# Patient Record
Sex: Female | Born: 1955 | Hispanic: Yes | Marital: Married
Health system: Southern US, Community
[De-identification: ages and names within clinical notes are randomized; demographics above are authoritative.]

---

## 2004-08-29 ENCOUNTER — Ambulatory Visit: Payer: Self-pay

## 2004-10-17 ENCOUNTER — Emergency Department: Payer: Self-pay | Admitting: Emergency Medicine

## 2004-11-01 ENCOUNTER — Ambulatory Visit: Payer: Self-pay

## 2004-11-06 ENCOUNTER — Encounter: Payer: Self-pay | Admitting: Family Medicine

## 2005-11-26 ENCOUNTER — Ambulatory Visit: Payer: Self-pay | Admitting: Obstetrics and Gynecology

## 2005-11-26 ENCOUNTER — Ambulatory Visit: Payer: Self-pay

## 2005-12-20 ENCOUNTER — Ambulatory Visit: Payer: Self-pay | Admitting: Gastroenterology

## 2006-04-22 ENCOUNTER — Ambulatory Visit: Payer: Self-pay | Admitting: Gastroenterology

## 2006-06-24 ENCOUNTER — Ambulatory Visit: Payer: Self-pay | Admitting: Gastroenterology

## 2006-12-02 ENCOUNTER — Ambulatory Visit: Payer: Self-pay | Admitting: Obstetrics and Gynecology

## 2007-06-01 ENCOUNTER — Ambulatory Visit: Payer: Self-pay | Admitting: Obstetrics and Gynecology

## 2007-12-03 ENCOUNTER — Ambulatory Visit: Payer: Self-pay | Admitting: Obstetrics and Gynecology

## 2008-02-29 ENCOUNTER — Ambulatory Visit: Payer: Self-pay | Admitting: Gastroenterology

## 2008-08-03 ENCOUNTER — Ambulatory Visit (HOSPITAL_COMMUNITY): Admission: RE | Admit: 2008-08-03 | Discharge: 2008-08-03 | Payer: Self-pay | Admitting: Orthopedic Surgery

## 2008-09-29 ENCOUNTER — Ambulatory Visit (HOSPITAL_BASED_OUTPATIENT_CLINIC_OR_DEPARTMENT_OTHER): Admission: RE | Admit: 2008-09-29 | Discharge: 2008-09-30 | Payer: Self-pay | Admitting: Orthopedic Surgery

## 2009-02-22 ENCOUNTER — Ambulatory Visit: Payer: Self-pay

## 2010-03-15 ENCOUNTER — Ambulatory Visit: Payer: Self-pay | Admitting: Obstetrics and Gynecology

## 2010-04-20 LAB — HEPATIC FUNCTION PANEL
ALT: 80 U/L — ABNORMAL HIGH (ref 0–35)
Alkaline Phosphatase: 82 U/L (ref 39–117)
Bilirubin, Direct: 0.2 mg/dL (ref 0.0–0.3)
Indirect Bilirubin: 0.7 mg/dL (ref 0.3–0.9)
Total Protein: 7.3 g/dL (ref 6.0–8.3)

## 2010-04-20 LAB — POCT HEMOGLOBIN-HEMACUE: Hemoglobin: 16.1 g/dL — ABNORMAL HIGH (ref 12.0–15.0)

## 2010-04-20 LAB — BASIC METABOLIC PANEL
BUN: 14 mg/dL (ref 6–23)
Creatinine, Ser: 0.46 mg/dL (ref 0.4–1.2)
GFR calc non Af Amer: >60 mL/min (ref >60)
Glucose, Bld: 108 mg/dL — ABNORMAL HIGH (ref 70–99)
Potassium: 3.7 mEq/L (ref 3.5–5.1)

## 2012-08-19 ENCOUNTER — Ambulatory Visit: Payer: Self-pay | Admitting: Obstetrics and Gynecology

## 2013-06-19 ENCOUNTER — Emergency Department: Payer: Self-pay | Admitting: Emergency Medicine

## 2013-06-19 LAB — BASIC METABOLIC PANEL
Anion Gap: 5 — ABNORMAL LOW (ref 7–16)
BUN: 14 mg/dL (ref 7–18)
Calcium, Total: 9.4 mg/dL (ref 8.5–10.1)
Chloride: 104 mmol/L (ref 98–107)
Co2: 30 mmol/L (ref 21–32)
Creatinine: 0.63 mg/dL (ref 0.60–1.30)
EGFR (African American): >60
EGFR (Non-African Amer.): >60
Glucose: 143 mg/dL — ABNORMAL HIGH (ref 65–99)
Osmolality: 280 (ref 275–301)
Potassium: 3.7 mmol/L (ref 3.5–5.1)
Sodium: 139 mmol/L (ref 136–145)

## 2013-06-19 LAB — HEPATIC FUNCTION PANEL A (ARMC)
ALK PHOS: 118 U/L — AB
ALT: 135 U/L — AB (ref 12–78)
AST: 69 U/L — AB (ref 15–37)
Albumin: 4.2 g/dL (ref 3.4–5.0)
Bilirubin, Direct: <0.1 mg/dL (ref 0.00–0.20)
Bilirubin,Total: 0.5 mg/dL (ref 0.2–1.0)
TOTAL PROTEIN: 8.1 g/dL (ref 6.4–8.2)

## 2013-06-19 LAB — CBC
HCT: 44.9 % (ref 35.0–47.0)
HGB: 15 g/dL (ref 12.0–16.0)
MCH: 28.7 pg (ref 26.0–34.0)
MCHC: 33.4 g/dL (ref 32.0–36.0)
MCV: 86 fL (ref 80–100)
Platelet: 251 10*3/uL (ref 150–440)
RBC: 5.22 10*6/uL — ABNORMAL HIGH (ref 3.80–5.20)
RDW: 12.3 % (ref 11.5–14.5)
WBC: 8.8 10*3/uL (ref 3.6–11.0)

## 2013-06-19 LAB — TROPONIN I: Troponin-I: <0.02 ng/mL

## 2014-10-03 ENCOUNTER — Other Ambulatory Visit: Payer: Self-pay | Admitting: Obstetrics and Gynecology

## 2014-10-03 DIAGNOSIS — Z1231 Encounter for screening mammogram for malignant neoplasm of breast: Secondary | ICD-10-CM

## 2014-10-13 ENCOUNTER — Ambulatory Visit
Admission: RE | Admit: 2014-10-13 | Discharge: 2014-10-13 | Disposition: A | Discharge status: Home / Self Care | Payer: 59 | Source: Ambulatory Visit | Attending: Obstetrics and Gynecology | Admitting: Obstetrics and Gynecology

## 2014-10-13 DIAGNOSIS — Z1231 Encounter for screening mammogram for malignant neoplasm of breast: Secondary | ICD-10-CM | POA: Insufficient documentation

## 2014-10-17 ENCOUNTER — Other Ambulatory Visit: Payer: Self-pay | Admitting: Obstetrics and Gynecology

## 2014-10-17 DIAGNOSIS — R928 Other abnormal and inconclusive findings on diagnostic imaging of breast: Secondary | ICD-10-CM

## 2014-10-17 DIAGNOSIS — N6489 Other specified disorders of breast: Secondary | ICD-10-CM

## 2014-10-27 ENCOUNTER — Ambulatory Visit
Admission: RE | Admit: 2014-10-27 | Discharge: 2014-10-27 | Disposition: A | Discharge status: Home / Self Care | Payer: 59 | Source: Ambulatory Visit | Attending: Obstetrics and Gynecology | Admitting: Obstetrics and Gynecology

## 2014-10-27 DIAGNOSIS — R928 Other abnormal and inconclusive findings on diagnostic imaging of breast: Secondary | ICD-10-CM | POA: Insufficient documentation

## 2014-10-27 DIAGNOSIS — N6489 Other specified disorders of breast: Secondary | ICD-10-CM

## 2015-10-16 ENCOUNTER — Other Ambulatory Visit: Payer: Self-pay | Admitting: Obstetrics and Gynecology

## 2015-10-17 ENCOUNTER — Other Ambulatory Visit: Payer: Self-pay | Admitting: Obstetrics and Gynecology

## 2015-10-17 DIAGNOSIS — Z1231 Encounter for screening mammogram for malignant neoplasm of breast: Secondary | ICD-10-CM

## 2015-10-17 DIAGNOSIS — Z13820 Encounter for screening for osteoporosis: Secondary | ICD-10-CM

## 2015-11-27 ENCOUNTER — Ambulatory Visit
Admission: RE | Admit: 2015-11-27 | Discharge: 2015-11-27 | Disposition: A | Discharge status: Home / Self Care | Payer: 59 | Source: Ambulatory Visit | Attending: Obstetrics and Gynecology | Admitting: Obstetrics and Gynecology

## 2015-11-27 DIAGNOSIS — Z1231 Encounter for screening mammogram for malignant neoplasm of breast: Secondary | ICD-10-CM | POA: Diagnosis not present

## 2015-11-28 ENCOUNTER — Other Ambulatory Visit: Payer: Self-pay | Admitting: Obstetrics and Gynecology

## 2015-11-28 DIAGNOSIS — N6310 Unspecified lump in the right breast, unspecified quadrant: Secondary | ICD-10-CM

## 2015-12-12 ENCOUNTER — Ambulatory Visit
Admission: RE | Admit: 2015-12-12 | Discharge: 2015-12-12 | Disposition: A | Discharge status: Home / Self Care | Payer: 59 | Source: Ambulatory Visit | Attending: Obstetrics and Gynecology | Admitting: Obstetrics and Gynecology

## 2015-12-12 DIAGNOSIS — N6314 Unspecified lump in the right breast, lower inner quadrant: Secondary | ICD-10-CM | POA: Diagnosis not present

## 2015-12-12 DIAGNOSIS — N6310 Unspecified lump in the right breast, unspecified quadrant: Secondary | ICD-10-CM

## 2015-12-12 DIAGNOSIS — R921 Mammographic calcification found on diagnostic imaging of breast: Secondary | ICD-10-CM | POA: Insufficient documentation

## 2015-12-14 ENCOUNTER — Other Ambulatory Visit: Payer: Self-pay | Admitting: Obstetrics and Gynecology

## 2015-12-14 DIAGNOSIS — N6310 Unspecified lump in the right breast, unspecified quadrant: Secondary | ICD-10-CM

## 2015-12-14 DIAGNOSIS — R928 Other abnormal and inconclusive findings on diagnostic imaging of breast: Secondary | ICD-10-CM

## 2016-01-04 ENCOUNTER — Ambulatory Visit: Payer: 59

## 2016-01-12 ENCOUNTER — Ambulatory Visit
Admission: RE | Admit: 2016-01-12 | Discharge: 2016-01-12 | Disposition: A | Discharge status: Home / Self Care | Payer: 59 | Source: Ambulatory Visit | Attending: Obstetrics and Gynecology | Admitting: Obstetrics and Gynecology

## 2016-01-12 DIAGNOSIS — N6001 Solitary cyst of right breast: Secondary | ICD-10-CM | POA: Insufficient documentation

## 2016-01-12 DIAGNOSIS — R928 Other abnormal and inconclusive findings on diagnostic imaging of breast: Secondary | ICD-10-CM

## 2016-01-12 DIAGNOSIS — N6310 Unspecified lump in the right breast, unspecified quadrant: Secondary | ICD-10-CM

## 2016-01-12 HISTORY — PX: BREAST CYST ASPIRATION: SHX578

## 2016-08-20 ENCOUNTER — Other Ambulatory Visit: Payer: Self-pay | Admitting: Obstetrics and Gynecology

## 2016-08-20 ENCOUNTER — Other Ambulatory Visit: Payer: Self-pay | Admitting: Family Medicine

## 2016-08-20 DIAGNOSIS — Z1231 Encounter for screening mammogram for malignant neoplasm of breast: Secondary | ICD-10-CM

## 2016-09-03 ENCOUNTER — Ambulatory Visit
Admission: RE | Admit: 2016-09-03 | Discharge: 2016-09-03 | Disposition: A | Discharge status: Home / Self Care | Payer: BLUE CROSS/BLUE SHIELD | Source: Ambulatory Visit | Attending: Family Medicine | Admitting: Family Medicine

## 2016-09-03 DIAGNOSIS — Z1231 Encounter for screening mammogram for malignant neoplasm of breast: Secondary | ICD-10-CM | POA: Insufficient documentation

## 2017-09-08 ENCOUNTER — Other Ambulatory Visit: Payer: Self-pay | Admitting: Obstetrics and Gynecology

## 2017-09-08 DIAGNOSIS — Z1231 Encounter for screening mammogram for malignant neoplasm of breast: Secondary | ICD-10-CM

## 2017-10-02 ENCOUNTER — Ambulatory Visit
Admission: RE | Admit: 2017-10-02 | Discharge: 2017-10-02 | Disposition: A | Discharge status: Home / Self Care | Payer: BLUE CROSS/BLUE SHIELD | Source: Ambulatory Visit | Attending: Obstetrics and Gynecology | Admitting: Obstetrics and Gynecology

## 2017-10-02 DIAGNOSIS — Z1231 Encounter for screening mammogram for malignant neoplasm of breast: Secondary | ICD-10-CM | POA: Diagnosis not present

## 2018-10-23 ENCOUNTER — Other Ambulatory Visit: Payer: Self-pay | Admitting: Obstetrics and Gynecology

## 2018-10-23 DIAGNOSIS — Z1231 Encounter for screening mammogram for malignant neoplasm of breast: Secondary | ICD-10-CM

## 2018-11-25 ENCOUNTER — Ambulatory Visit
Admission: RE | Admit: 2018-11-25 | Discharge: 2018-11-25 | Disposition: A | Discharge status: Home / Self Care | Payer: BC Managed Care – PPO | Source: Ambulatory Visit | Attending: Obstetrics and Gynecology | Admitting: Obstetrics and Gynecology

## 2018-11-25 ENCOUNTER — Other Ambulatory Visit: Payer: Self-pay

## 2018-11-25 ENCOUNTER — Encounter (INDEPENDENT_AMBULATORY_CARE_PROVIDER_SITE_OTHER): Payer: Self-pay

## 2018-11-25 DIAGNOSIS — Z1231 Encounter for screening mammogram for malignant neoplasm of breast: Secondary | ICD-10-CM | POA: Diagnosis not present

## 2019-10-01 ENCOUNTER — Other Ambulatory Visit: Payer: Self-pay | Admitting: Obstetrics and Gynecology

## 2019-10-01 DIAGNOSIS — Z1231 Encounter for screening mammogram for malignant neoplasm of breast: Secondary | ICD-10-CM

## 2019-11-26 ENCOUNTER — Ambulatory Visit
Admission: RE | Admit: 2019-11-26 | Discharge: 2019-11-26 | Disposition: A | Discharge status: Home / Self Care | Payer: BC Managed Care – PPO | Source: Ambulatory Visit | Attending: Obstetrics and Gynecology | Admitting: Obstetrics and Gynecology

## 2019-11-26 ENCOUNTER — Other Ambulatory Visit: Payer: Self-pay

## 2019-11-26 DIAGNOSIS — Z1231 Encounter for screening mammogram for malignant neoplasm of breast: Secondary | ICD-10-CM | POA: Insufficient documentation

## 2020-09-07 ENCOUNTER — Encounter: Payer: BC Managed Care – PPO | Admitting: Obstetrics and Gynecology

## 2020-10-04 ENCOUNTER — Other Ambulatory Visit: Payer: Self-pay | Admitting: Obstetrics and Gynecology

## 2020-10-04 DIAGNOSIS — Z1231 Encounter for screening mammogram for malignant neoplasm of breast: Secondary | ICD-10-CM

## 2020-11-27 ENCOUNTER — Ambulatory Visit
Admission: RE | Admit: 2020-11-27 | Discharge: 2020-11-27 | Disposition: A | Discharge status: Home / Self Care | Payer: BC Managed Care – PPO | Source: Ambulatory Visit | Attending: Obstetrics and Gynecology | Admitting: Obstetrics and Gynecology

## 2020-11-27 ENCOUNTER — Other Ambulatory Visit: Payer: Self-pay

## 2020-11-27 DIAGNOSIS — Z1231 Encounter for screening mammogram for malignant neoplasm of breast: Secondary | ICD-10-CM | POA: Diagnosis not present

## 2020-12-19 ENCOUNTER — Encounter: Payer: BC Managed Care – PPO | Admitting: Obstetrics and Gynecology

## 2021-02-23 IMAGING — MG DIGITAL SCREENING BILAT W/ TOMO W/ CAD
6 of 10 series · 6 of 30 positions shown · non-contrast
Comparison: Previous exam(s).

CLINICAL DATA: Screening.

EXAM:
DIGITAL SCREENING BILATERAL MAMMOGRAM WITH TOMO AND CAD

[R CC synth-2D]
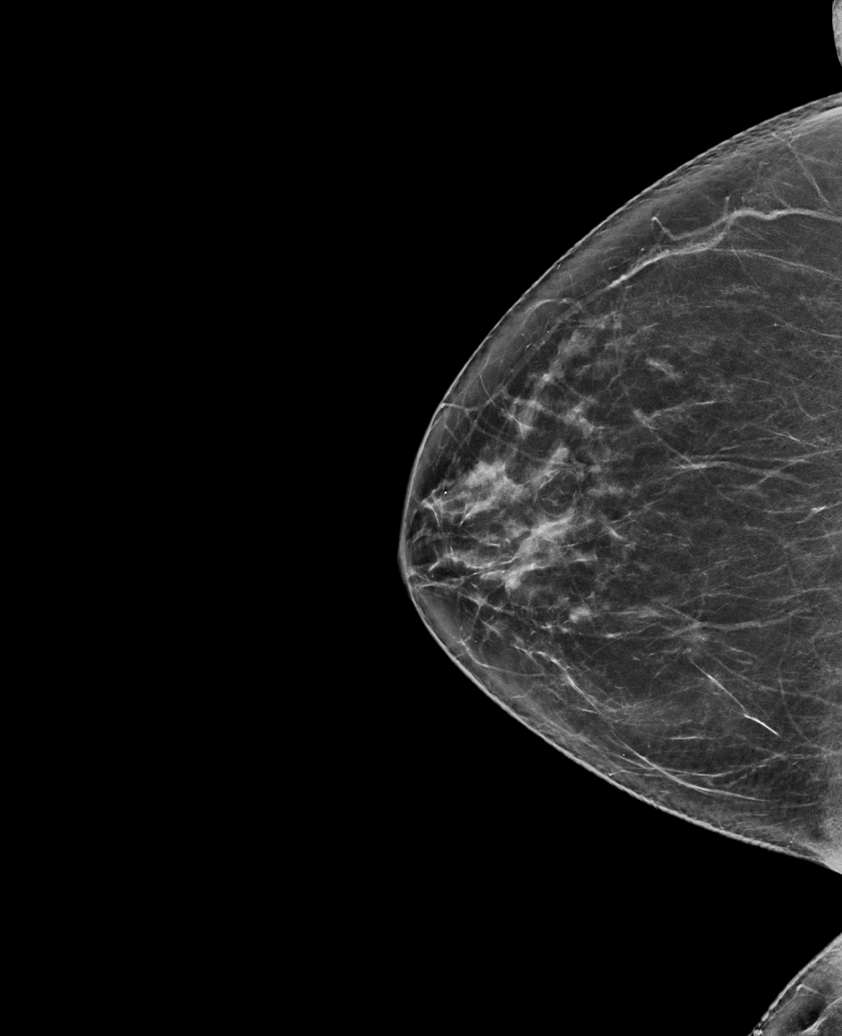

[L CC synth-2D]
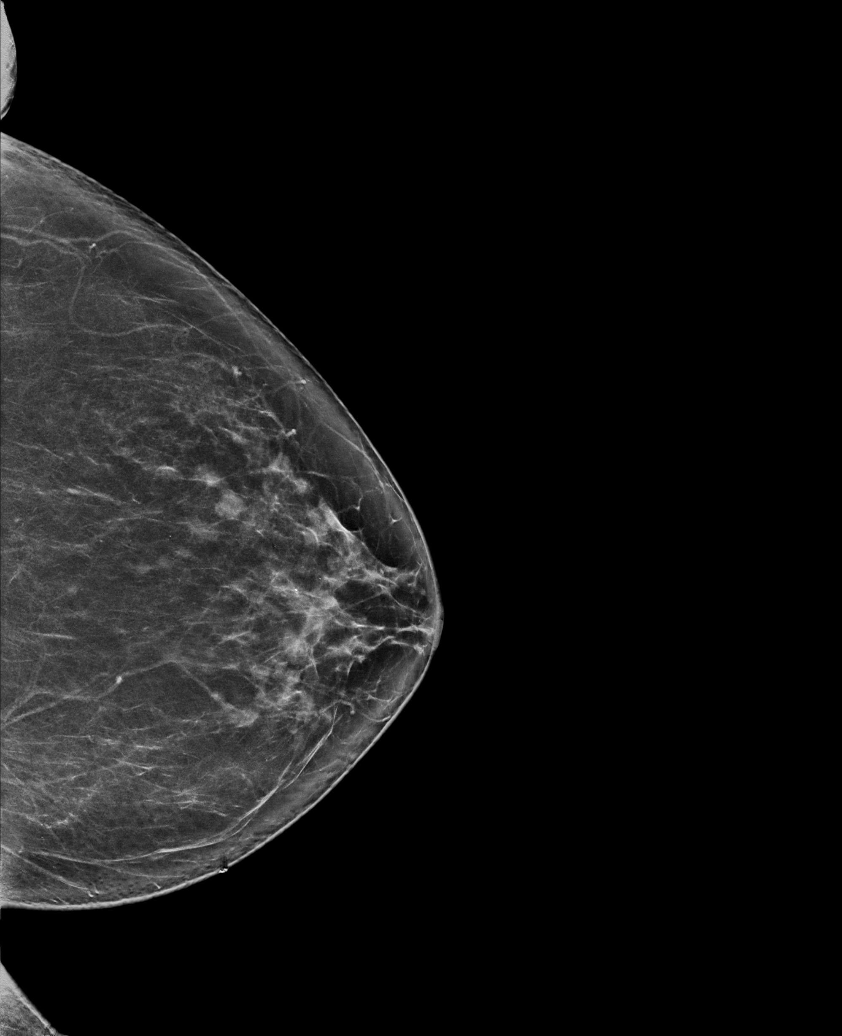

[L MLO synth-2D (1 of 2)]
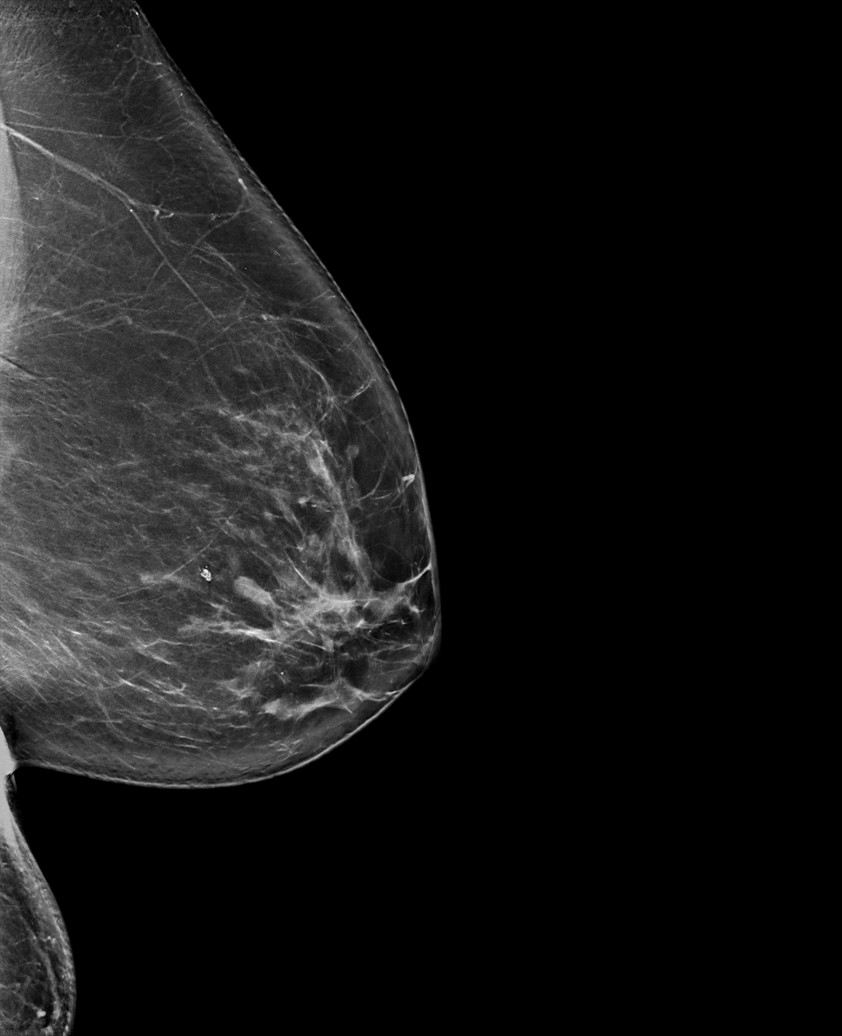

[R MLO synth-2D]
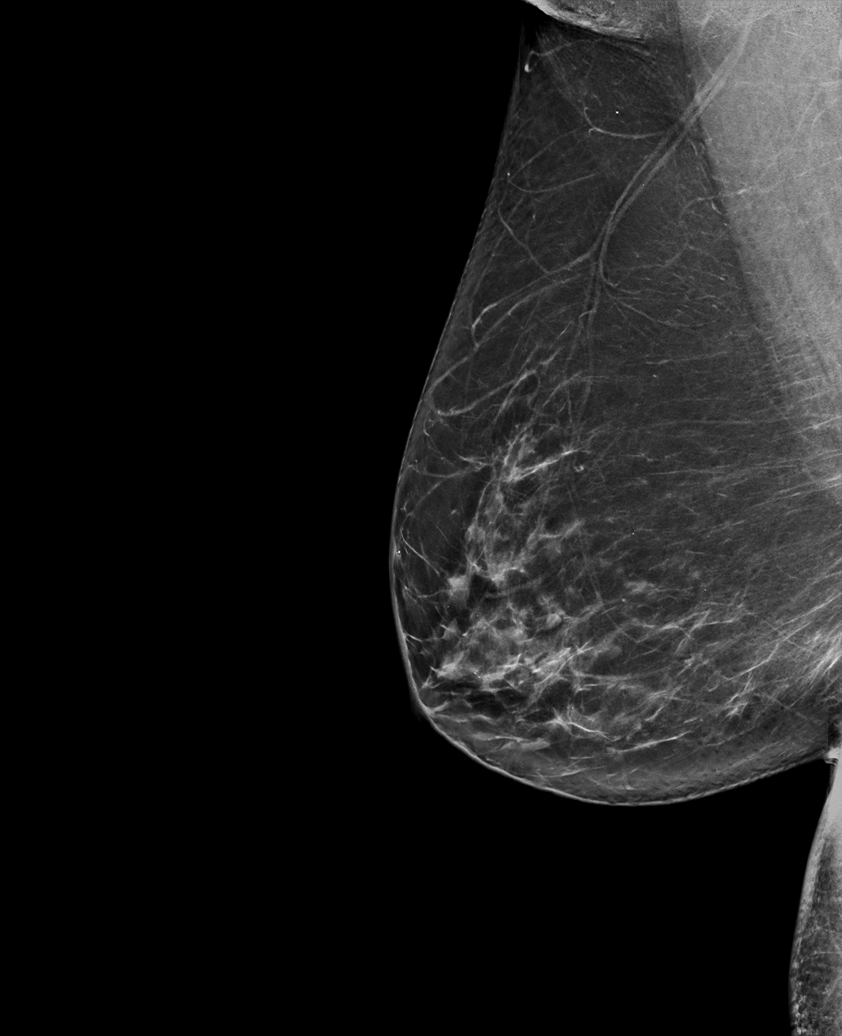

[L MLO synth-2D (2 of 2)]
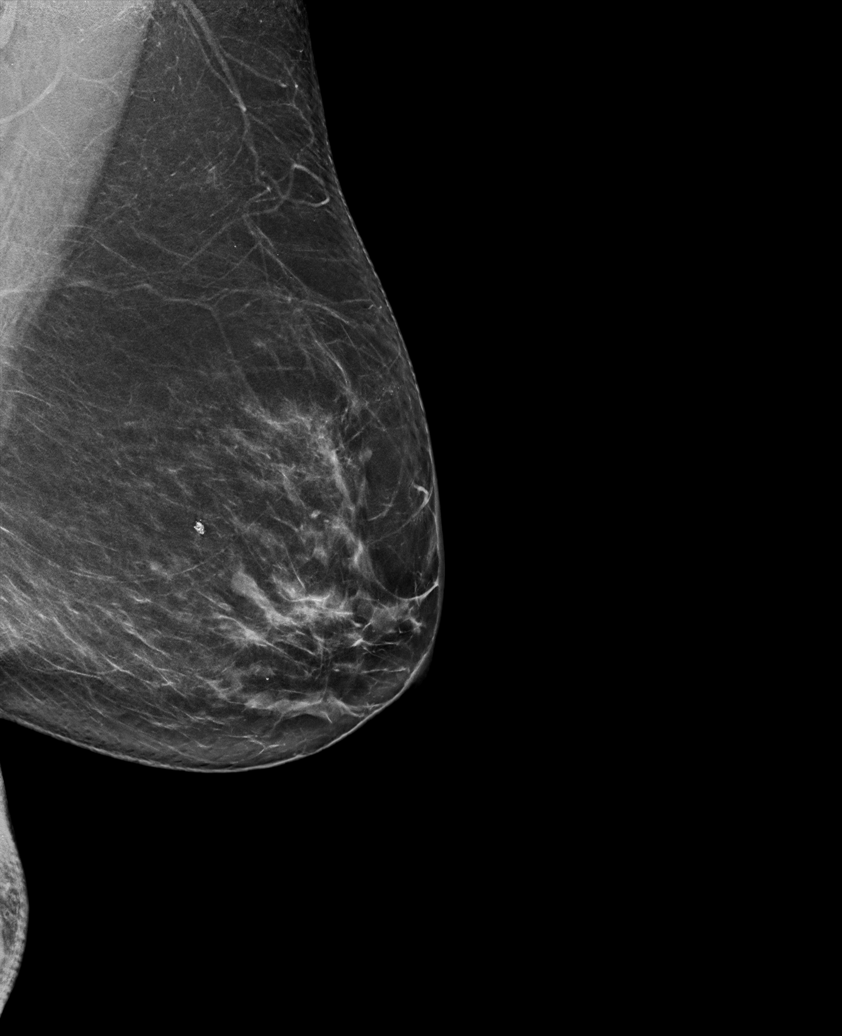

[L MLO tomo · tomo slice 43/84.0]
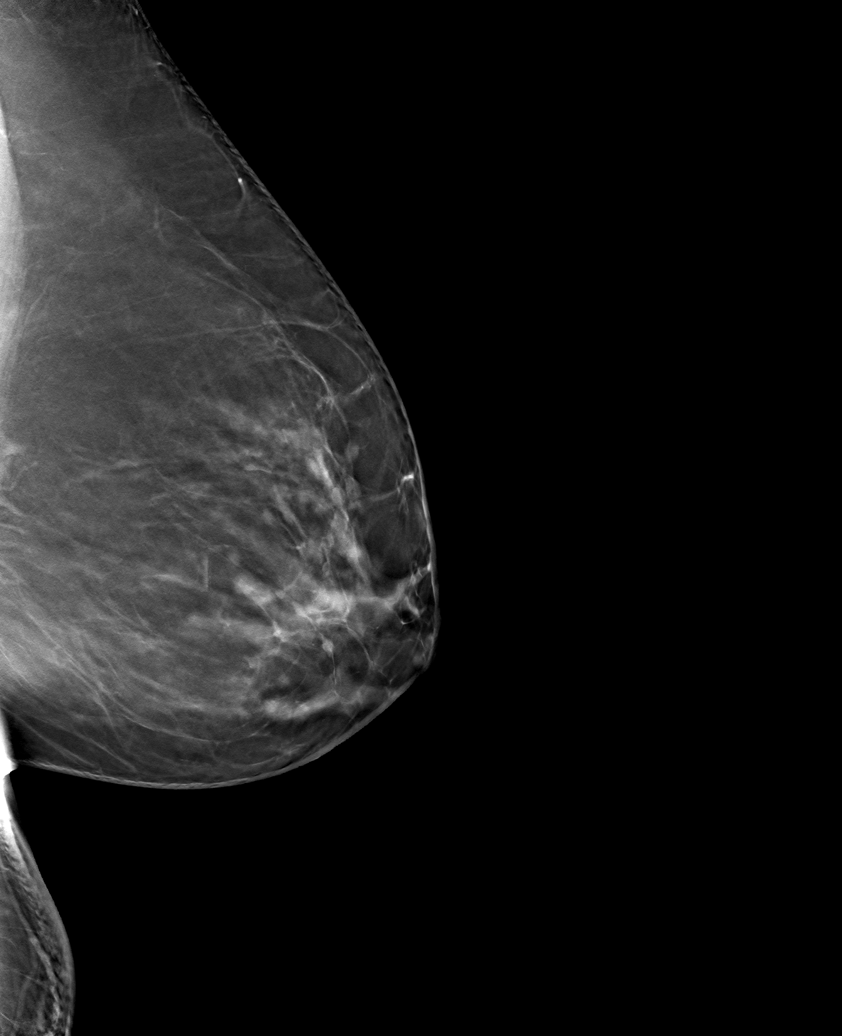

[6 of 30 positions shown; findings below may reference images not displayed]

ACR Breast Density Category b: There are scattered areas of
fibroglandular density.
FINDINGS: There are no findings suspicious for malignancy. Images were
processed with CAD.
IMPRESSION: No mammographic evidence of malignancy. A result letter of this
screening mammogram will be mailed directly to the patient.

RECOMMENDATION:
Screening mammogram in one year. (Code:[id number])

BI-RADS CATEGORY  1: Negative.

## 2021-07-09 ENCOUNTER — Other Ambulatory Visit: Payer: Self-pay | Admitting: Otolaryngology

## 2021-07-09 DIAGNOSIS — H9012 Conductive hearing loss, unilateral, left ear, with unrestricted hearing on the contralateral side: Secondary | ICD-10-CM

## 2021-07-09 DIAGNOSIS — H9212 Otorrhea, left ear: Secondary | ICD-10-CM

## 2021-07-27 ENCOUNTER — Ambulatory Visit
Admission: RE | Admit: 2021-07-27 | Discharge: 2021-07-27 | Disposition: A | Discharge status: Home / Self Care | Payer: Medicare Other | Source: Ambulatory Visit | Attending: Otolaryngology | Admitting: Otolaryngology

## 2021-07-27 DIAGNOSIS — H9012 Conductive hearing loss, unilateral, left ear, with unrestricted hearing on the contralateral side: Secondary | ICD-10-CM

## 2021-07-27 DIAGNOSIS — H9212 Otorrhea, left ear: Secondary | ICD-10-CM

## 2021-09-27 ENCOUNTER — Other Ambulatory Visit: Payer: Self-pay | Admitting: Obstetrics and Gynecology

## 2021-09-27 DIAGNOSIS — Z1231 Encounter for screening mammogram for malignant neoplasm of breast: Secondary | ICD-10-CM

## 2021-11-28 ENCOUNTER — Ambulatory Visit
Admission: RE | Admit: 2021-11-28 | Discharge: 2021-11-28 | Disposition: A | Discharge status: Home / Self Care | Payer: Medicare Other | Source: Ambulatory Visit | Attending: Obstetrics and Gynecology | Admitting: Obstetrics and Gynecology

## 2021-11-28 DIAGNOSIS — Z1231 Encounter for screening mammogram for malignant neoplasm of breast: Secondary | ICD-10-CM | POA: Insufficient documentation

## 2022-01-09 ENCOUNTER — Ambulatory Visit: Payer: Medicare Other | Admitting: Podiatry

## 2022-01-09 DIAGNOSIS — B070 Plantar wart: Secondary | ICD-10-CM | POA: Diagnosis not present

## 2022-01-09 NOTE — Patient Instructions (Addendum)
Take dressing off in 8 hours and wash the foot with soap and water. If it is hurting or becomes uncomfortable before the 8 hours, go ahead and remove the bandage and wash the area.  If it blisters, apply antibiotic ointment and a band-aid.   Monitor for any signs/symptoms of infection. Call the office immediately if any occur or go directly to the emergency room. Call with any questions/concerns.    Look for urea 40% cream or ointment and apply to the thickened dry skin / calluses. This can be bought over the counter, at a pharmacy or online such as [employer].  

## 2022-01-12 NOTE — Progress Notes (Signed)
  Subjective:  Patient ID: Tracy Bates, female    DOB: April 29, 1955,  MRN: [id number]  Chief Complaint  Patient presents with   Callouses    (np) R callus under great toe, pus    66 y.o. female presents with the above complaint. History confirmed with patient.   Objective:  Physical Exam: warm, good capillary refill, no trophic changes or ulcerative lesions, normal DP and PT pulses, normal sensory exam, and painful lesion plantar right hallux consistent with verruca plantaris, no signs infection or abscess .  Assessment:   1. Verruca plantaris      Plan:  Patient was evaluated and treated and all questions answered.   Discussed etiology and treatment of verruca plantaris in detail with the patient as well as multiple treatment options including blistering agents, chemotherapeutic agents, surgical excision, laser therapy and the indications and roles of the above.  Today, recommended treatment with Cantharone as noted in procedure note below.  Follow-up in 3 weeks for reevaluation  Procedure: Destruction of Lesion Location: Right hallux Instrumentation: 15 blade. Technique: Debridement of lesion to petechial bleeding. Aperture pad applied around lesion. Small amount of canthrone applied to the base of the lesion. Dressing: Dry, sterile, compression dressing. Disposition: Patient tolerated procedure well. Advised to leave dressing on for 6-8 hours. Thereafter patient to wash the area with soap and water and applied band-aid. Off-loading pads dispensed. Patient to return in 3 weeks for follow-up.   Return in about 26 days (around 02/04/2022) for wart treatment.

## 2022-02-04 ENCOUNTER — Ambulatory Visit: Payer: Medicare Other | Admitting: Podiatry

## 2022-02-04 DIAGNOSIS — B070 Plantar wart: Secondary | ICD-10-CM

## 2022-02-04 NOTE — Progress Notes (Signed)
  Subjective:  Patient ID: Tracy Bates, female    DOB: 1955-05-09,  MRN: [id number]  Chief Complaint  Patient presents with   Plantar Warts    3 week follow up for wart treatment    67 y.o. female presents with the above complaint. History confirmed with patient.   Objective:  Physical Exam: warm, good capillary refill, no trophic changes or ulcerative lesions, normal DP and PT pulses, normal sensory exam, and painful lesion plantar right hallux consistent with verruca plantaris much smaller, some lesion remains, no signs infection or abscess .  Assessment:   1. Verruca plantaris       Plan:  Patient was evaluated and treated and all questions answered.   Improved greatly. Small area residual.  Today, recommended treatment with Cantharone as noted in procedure note below.  Follow-up PRN now  Procedure: Destruction of Lesion Location: Right hallux Instrumentation: 15 blade. Technique: Debridement of lesion to petechial bleeding. Aperture pad applied around lesion. Small amount of canthrone applied to the base of the lesion. Dressing: Dry, sterile, compression dressing. Disposition: Patient tolerated procedure well. Advised to leave dressing on for 6-8 hours. Thereafter patient to wash the area with soap and water and applied band-aid. Off-loading pads dispensed.    Return if symptoms worsen or fail to improve.

## 2022-12-02 ENCOUNTER — Other Ambulatory Visit: Payer: Self-pay | Admitting: Obstetrics & Gynecology

## 2022-12-02 DIAGNOSIS — Z1231 Encounter for screening mammogram for malignant neoplasm of breast: Secondary | ICD-10-CM

## 2022-12-25 ENCOUNTER — Ambulatory Visit
Admission: RE | Admit: 2022-12-25 | Discharge: 2022-12-25 | Disposition: A | Discharge status: Home / Self Care | Payer: Medicare Other | Source: Ambulatory Visit | Attending: Obstetrics & Gynecology | Admitting: Obstetrics & Gynecology

## 2022-12-25 DIAGNOSIS — Z1231 Encounter for screening mammogram for malignant neoplasm of breast: Secondary | ICD-10-CM | POA: Insufficient documentation

## 2023-10-23 ENCOUNTER — Other Ambulatory Visit: Payer: Self-pay | Admitting: Obstetrics & Gynecology

## 2023-10-23 DIAGNOSIS — Z1231 Encounter for screening mammogram for malignant neoplasm of breast: Secondary | ICD-10-CM

## 2023-12-26 ENCOUNTER — Ambulatory Visit
Admission: RE | Admit: 2023-12-26 | Discharge: 2023-12-26 | Disposition: A | Source: Ambulatory Visit | Attending: Obstetrics & Gynecology | Admitting: Obstetrics & Gynecology

## 2023-12-26 DIAGNOSIS — Z1231 Encounter for screening mammogram for malignant neoplasm of breast: Secondary | ICD-10-CM

## 2024-01-05 ENCOUNTER — Ambulatory Visit: Admission: RE | Admit: 2024-01-05 | Admitting: Gastroenterology

## 2024-01-05 ENCOUNTER — Encounter: Admission: RE | Payer: Self-pay

## 2024-01-05 SURGERY — COLONOSCOPY
Anesthesia: General
